# Patient Record
Sex: Male | Born: 1950 | Race: White | Hispanic: No | Marital: Married | Smoking: Former smoker
Health system: Southern US, Community
[De-identification: ages and names within clinical notes are randomized; demographics above are authoritative.]

## PROBLEM LIST (undated history)

## (undated) DIAGNOSIS — D039 Melanoma in situ, unspecified: Secondary | ICD-10-CM

## (undated) DIAGNOSIS — Z720 Tobacco use: Secondary | ICD-10-CM

## (undated) DIAGNOSIS — C439 Malignant melanoma of skin, unspecified: Secondary | ICD-10-CM

## (undated) DIAGNOSIS — R05 Cough: Secondary | ICD-10-CM

## (undated) DIAGNOSIS — E785 Hyperlipidemia, unspecified: Secondary | ICD-10-CM

## (undated) DIAGNOSIS — R058 Other specified cough: Secondary | ICD-10-CM

## (undated) DIAGNOSIS — C4492 Squamous cell carcinoma of skin, unspecified: Secondary | ICD-10-CM

## (undated) DIAGNOSIS — D229 Melanocytic nevi, unspecified: Secondary | ICD-10-CM

## (undated) HISTORY — DX: Cough: R05

## (undated) HISTORY — PX: CARDIAC CATHETERIZATION: SHX172

## (undated) HISTORY — DX: Other specified cough: R05.8

## (undated) HISTORY — DX: Tobacco use: Z72.0

## (undated) HISTORY — DX: Squamous cell carcinoma of skin, unspecified: C44.92

## (undated) HISTORY — DX: Melanocytic nevi, unspecified: D22.9

## (undated) HISTORY — PX: KNEE SURGERY: SHX244

## (undated) HISTORY — DX: Melanoma in situ, unspecified: D03.9

## (undated) HISTORY — DX: Hyperlipidemia, unspecified: E78.5

---
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
## 1998-06-27 ENCOUNTER — Ambulatory Visit (HOSPITAL_COMMUNITY): Admission: RE | Admit: 1998-06-27 | Discharge: 1998-06-27 | Payer: Self-pay | Admitting: Gastroenterology

## 2006-12-08 ENCOUNTER — Encounter: Admission: RE | Admit: 2006-12-08 | Discharge: 2006-12-08 | Payer: Self-pay | Admitting: Gastroenterology

## 2007-01-11 ENCOUNTER — Encounter (INDEPENDENT_AMBULATORY_CARE_PROVIDER_SITE_OTHER): Payer: Self-pay | Admitting: Gastroenterology

## 2007-01-11 ENCOUNTER — Ambulatory Visit (HOSPITAL_COMMUNITY): Admission: RE | Admit: 2007-01-11 | Discharge: 2007-01-11 | Payer: Self-pay | Admitting: Gastroenterology

## 2007-08-10 ENCOUNTER — Inpatient Hospital Stay (HOSPITAL_BASED_OUTPATIENT_CLINIC_OR_DEPARTMENT_OTHER): Admission: RE | Admit: 2007-08-10 | Discharge: 2007-08-10 | Payer: Self-pay | Admitting: Cardiology

## 2008-08-10 IMAGING — RF DG ESOPHAGUS
8 series · 20 of 24 positions shown · non-contrast
Comparison: none

CLINICAL DATA: Dysphagia.
 BARIUM ESOPHAGRAM:

[Series 1: run · 4 of 6 slices shown (1 of 8)]
[im 1/6]
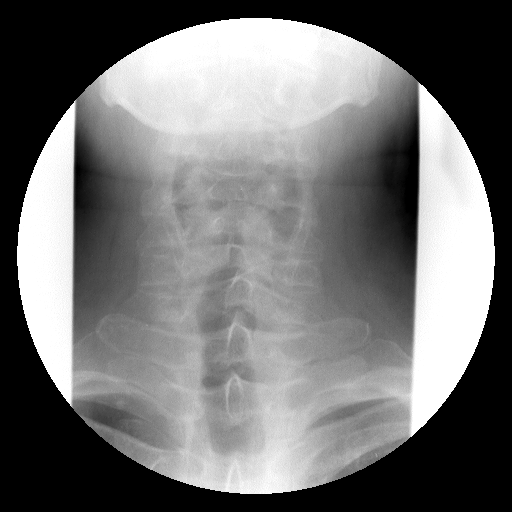
[im 2/6]
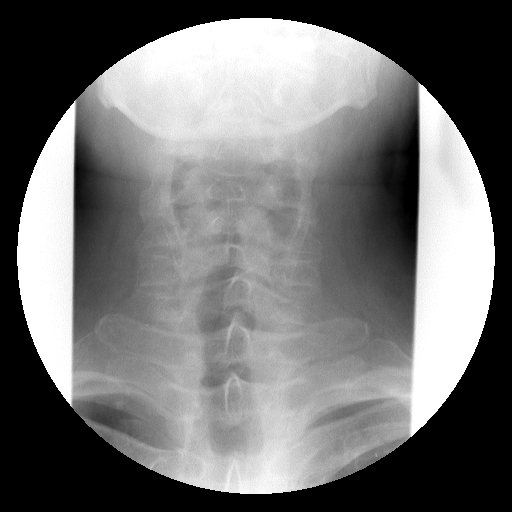
[im 4/6]
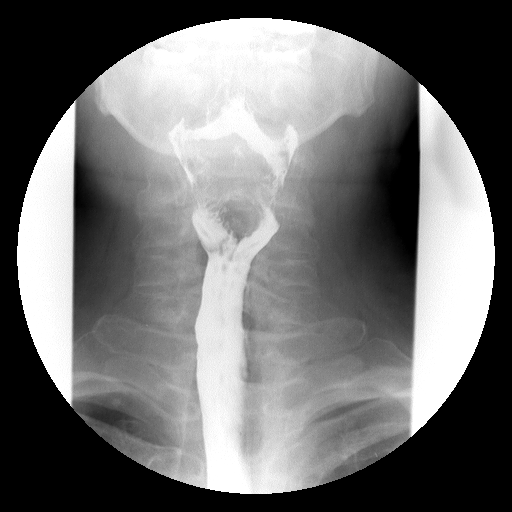
[im 6/6]
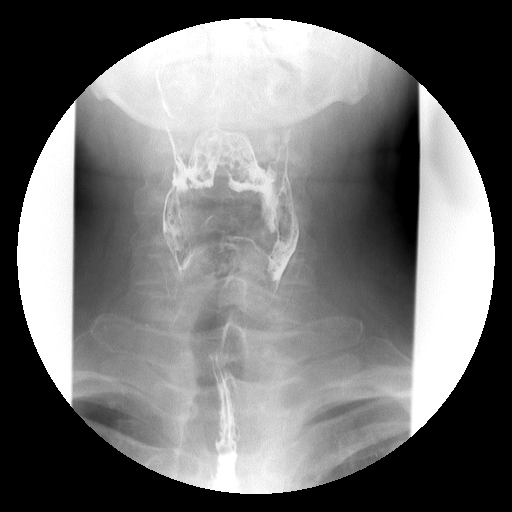

[Series 2: run · 2 of 2 slices shown (2 of 8)]
[im 1/2]
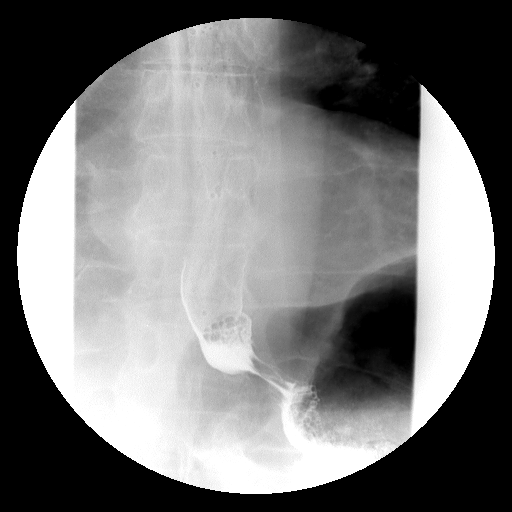
[im 2/2]
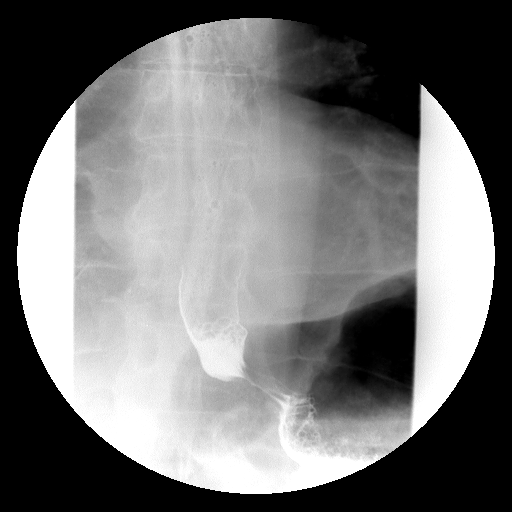

[Series 3: run · 3 of 5 slices shown (3 of 8)]
[im 1/5]
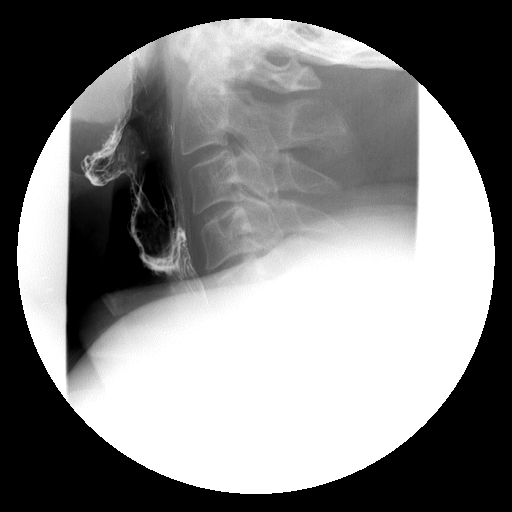
[im 3/5]
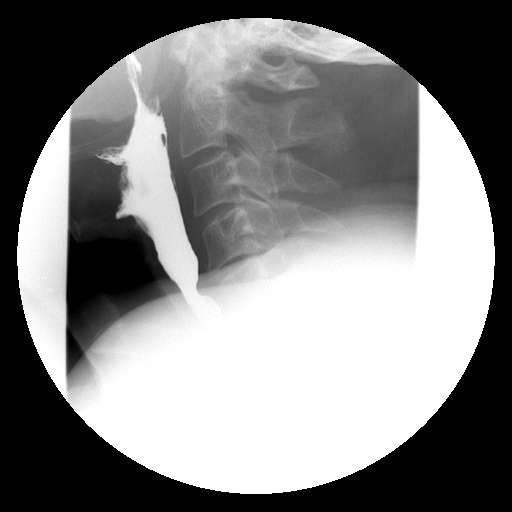
[im 5/5]
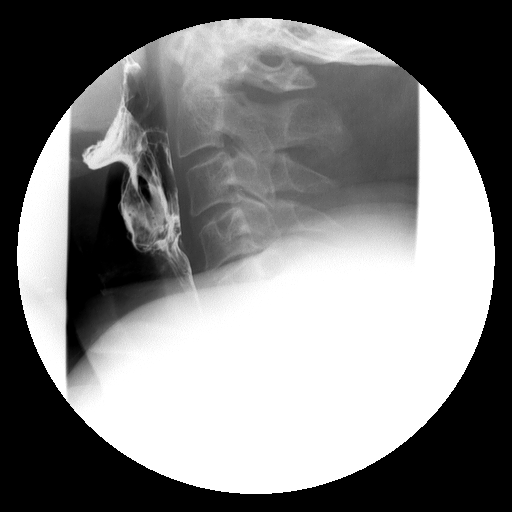

[Series 4: run · 3 of 4 slices shown (4 of 8)]
[im 1/4]
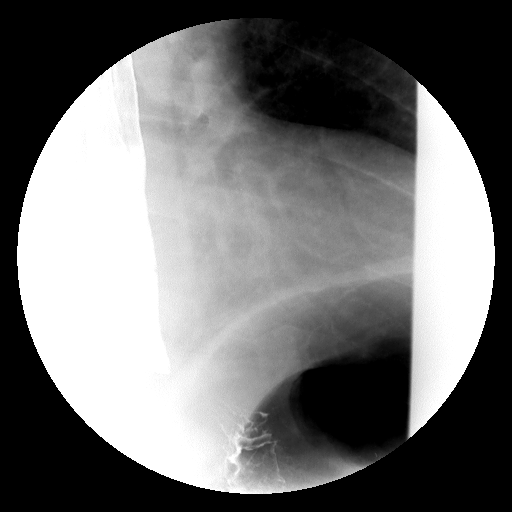
[im 2/4]
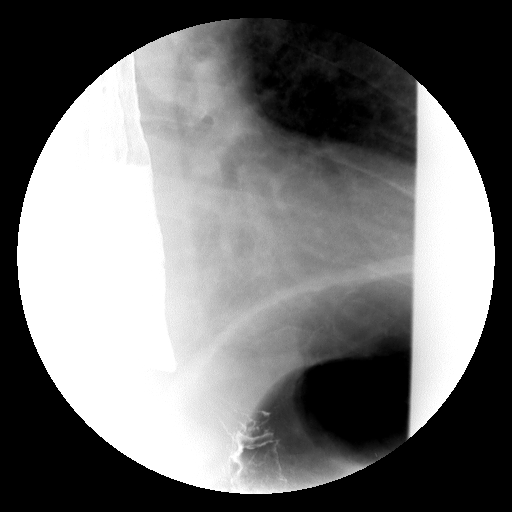
[im 4/4]
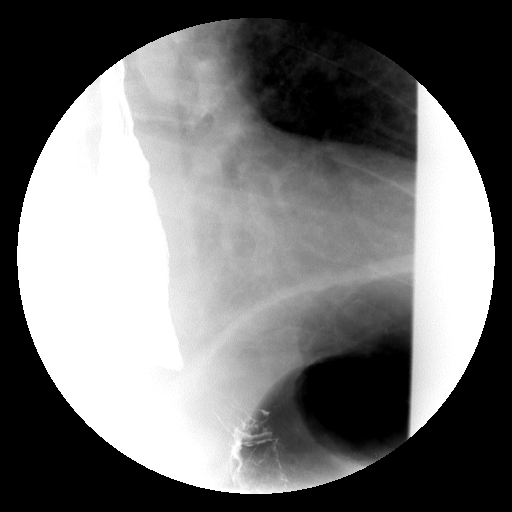

[Series 5: run · 1 of 2 slices shown (5 of 8)]
[im 2/2]
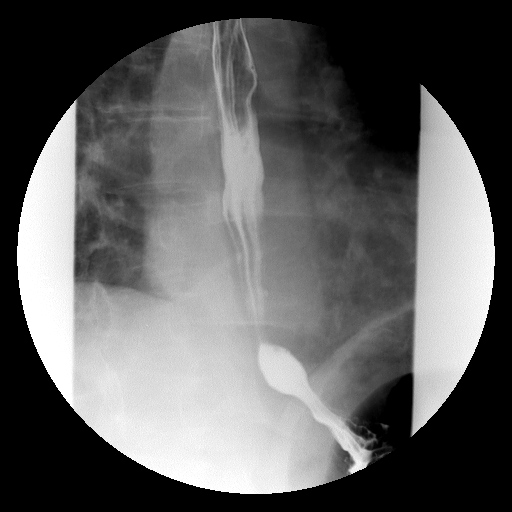

[Series 6: run · 3 of 3 slices shown (6 of 8)]
[im 1/3]
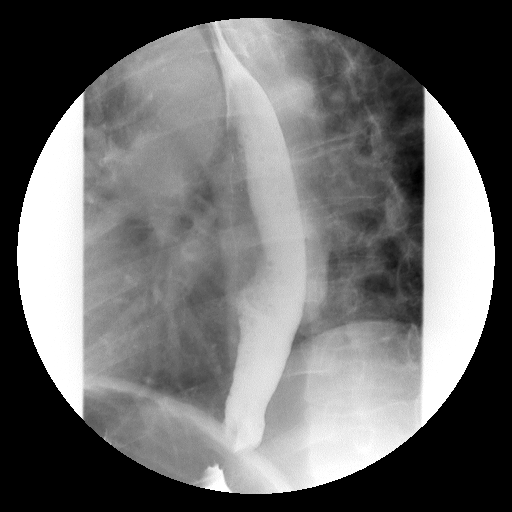
[im 2/3]
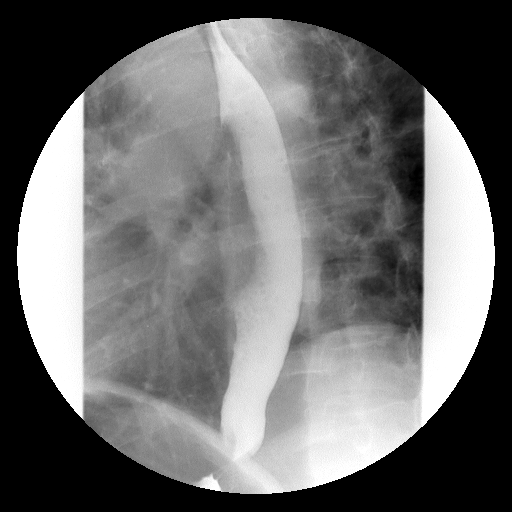
[im 3/3]
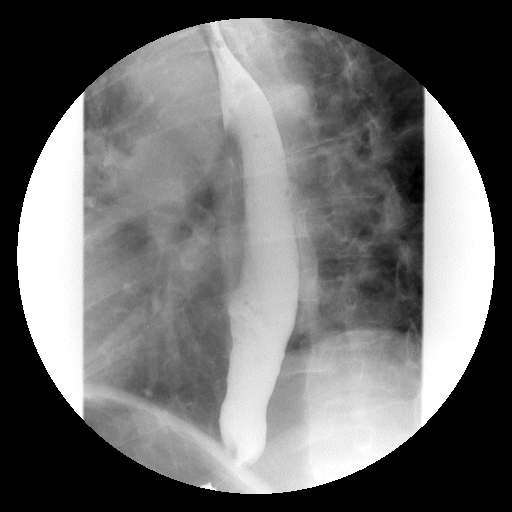

[Series 7: run · 1 of 2 slices shown (7 of 8)]
[im 1/2]
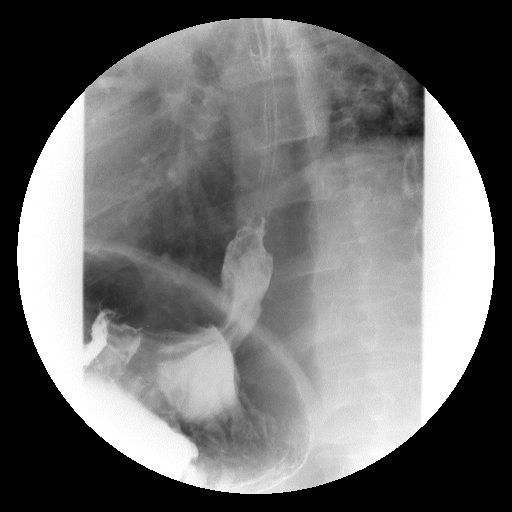

[Series 8: run · 3 of 4 slices shown (8 of 8)]
[im 1/4]
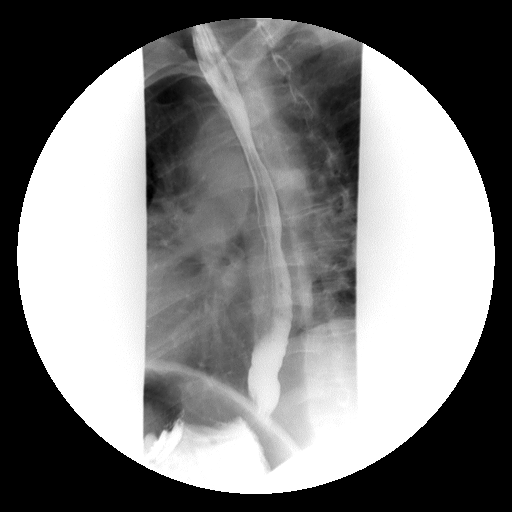
[im 2/4]
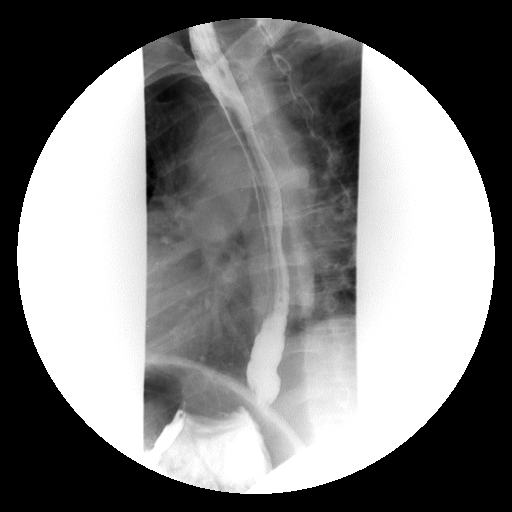
[im 4/4]
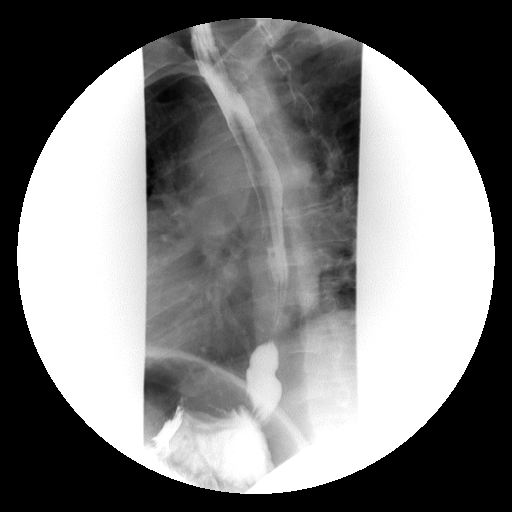

[20 of 24 positions shown; findings below may reference images not displayed]

FINDINGS: The primary peristaltic wave within the esophagus was normal.  There was no hiatal hernia.  There were no episodes of spontaneous gastroesophageal reflux.  A 13 mm in diameter barium tablet was administered and passed normally through the esophagus into the stomach.
IMPRESSION: Normal barium esophagram.

## 2010-11-12 ENCOUNTER — Encounter: Payer: Self-pay | Admitting: *Deleted

## 2010-11-12 DIAGNOSIS — Z720 Tobacco use: Secondary | ICD-10-CM | POA: Insufficient documentation

## 2010-11-12 DIAGNOSIS — R05 Cough: Secondary | ICD-10-CM

## 2010-11-12 DIAGNOSIS — R058 Other specified cough: Secondary | ICD-10-CM

## 2010-11-12 DIAGNOSIS — E785 Hyperlipidemia, unspecified: Secondary | ICD-10-CM | POA: Insufficient documentation

## 2010-11-26 NOTE — Cardiovascular Report (Signed)
NAMECOLMAN, Ryan Guzman NO.:  [id number]   MEDICAL RECORD NO.:  [id number]          PATIENT TYPE:  OIB   LOCATION:  NA                           FACILITY:  [facility]   PHYSICIAN:  Peter M. Swaziland, M.D.  DATE OF BIRTH:  [birth year]   DATE OF PROCEDURE:  08/10/2007  DATE OF DISCHARGE:                            CARDIAC CATHETERIZATION   INDICATION FOR PROCEDURE:  The patient is 60 year old white male who  presented with atypical chest pain.  He has a history of  hypercholesterolemia.  A stress Cardiolite study was abnormal at a high  workload, indicating some apical ischemia   PROCEDURES:  1. Left heart catheterization.  2. Coronary and left ventricular angiography.   ACCESS:  Via the right femoral artery using standard Seldinger  technique.   There were no complications.   MEDICATIONS:  Local anesthesia 1% Xylocaine, Versed 2 mg IV.   CONTRAST:  90 mL of Omnipaque.   EQUIPMENT:  4-French 5-cm left Judkins catheter, 4-French 3-D RC  catheter, 4-French pigtail catheter and 4-French arterial sheath.   HEMODYNAMIC DATA:  Aortic pressure:  Central aortic pressure was 109/67  with a mean of 86 mmHg.  Left ventricular pressure was 104 with an EDP  of 10 mmHg.  There was no significant aortic valve gradient.   ANGIOGRAPHIC DATA:  The left coronary arises and distributes normally.  The left main coronary artery is normal.   The left anterior descending artery has a 20-30% stenosis proximally  after the takeoff of the first septal perforator.  The LAD then extends  out to the apex without significant disease.  There is a modest diagonal  branch, which appears normal.  There is a small to moderate-sized  intermediate branch, which appears normal.   The left circumflex coronary gives rise to a large single obtuse  marginal vessel.  It is normal throughout its course.   The right coronary artery is a dominant vessel.  It has mild  irregularities in the proximal vessel  less than or equal to 20%.   Left ventricular angiography was performed in the RAO view.  This  demonstrates normal left ventricular size and contractility with normal  systolic function.  Ejection fraction is estimated at 60%.  There is no  mitral regurgitation or prolapse.   FINAL INTERPRETATION:  1. Nonobstructive atherosclerotic coronary artery disease.  2. Normal left ventricular function.   PLAN:  Would recommend continued risk factor modification.           ______________________________  Peter M. Swaziland, M.D.     PMJ/MEDQ  D:  08/10/2007  T:  08/10/2007  Job:  [id number]   cc:   Cassell Clement, M.D.  Gloriajean Dell. Andrey Campanile, M.D.

## 2010-11-26 NOTE — H&P (Signed)
NAMEJAYSTIN, MCGARVEY NO.:  [id number]   MEDICAL RECORD NO.:  [id number]           PATIENT TYPE:   LOCATION:                                 FACILITY:   PHYSICIAN:  Peter M. Swaziland, M.D.  DATE OF BIRTH:  1951-04-19   DATE OF ADMISSION:  DATE OF DISCHARGE:                              HISTORY & PHYSICAL   HISTORY OF PRESENT ILLNESS:  Mr. Ryan Guzman is a 60 year old white male who  is seen for evaluation of atypical chest pain.  The patient gives a  history of recent chest pain since January 11.  He describes it as a  sharp pain in the left pectoral region.  It does not radiate.  Pain has  been off and on but fairly constant.  He has tried antacids without  benefit.  He does have a chronic history of dyspepsia and increased  phlegm.  He has had a endoscopy done by Dr. Loreta Ave, who placed him on  Protonix and Zegerid, but apparently did not see significant acid  reflux.  The patient has generally been active, previously exercised and  ran in triathlons but several years ago due to a job change his exercise  activity has  been curtailed.  He has gained a significant amount of  weight.  He complains of a lack of energy, staying tired all the time,  and he does have elevated cholesterol level.  Because of these atypical  chest pains, he did undergo a stress Cardiolite study on August 03, 2007.  The patient exercised very well with standard Bruce protocol,  able to exercise for 7 minutes and 30 seconds without chest pain or ECG  changes; however, his Cardiolite study demonstrated a reversible apical  defect consistent with ischemia.  His ejection fraction was 53%.  The  patient also had an echocardiogram performed, which showed mild aortic  valve sclerosis but otherwise a normal study.  Based on his abnormal  Cardiolite study, it was recommended he undergo cardiac catheterization.   PAST MEDICAL HISTORY:  Significant for previous arthroscopic surgery on  his left knee.  He  has a history of hypercholesterolemia with recent  lipid panel showed total cholesterol of 221, LDL of 144, HDL 36.   ALLERGIES:  SULFA AND AUGMENTIN.   CURRENT MEDICATIONS:  Protonix 40 mg b.i.d., aspirin 81 mg per day,  Zegerid 40 mg per day.   SOCIAL HISTORY:  He works for [employer] as a Merchandiser, retail.  He  smokes 2 to 3 cigarettes per day.  Denies alcohol use.  He is married.  His wife is a retired Surveyor, mining.  They have 2 children in  good health.   FAMILY HISTORY:  Father had coronary disease and aortic stenosis and  died after undergoing aortic valve replacement and bypass surgery.  Mother died of a myocardial infarction at age 60 but also had cancer.   REVIEW OF SYSTEMS:  Negative in detail.   PHYSICAL EXAMINATION:  GENERAL:  The patient is a pleasant white male in  no apparent distress.  VITAL SIGNS:  Weight is  252, blood pressure 118/82, pulse is 88 and  regular, respirations were normal.  HEENT:  He is normocephalic, atraumatic with pupils equal, round,  reactive to light and accommodation.  Extraocular movements are full.  Oropharynx is clear.  NECK:  Supple without JVD, adenopathy, thyromegaly or bruits.  LUNGS:  Clear to auscultation and percussion.  CARDIAC:  Exam reveals a regular rate and rhythm without gallop, murmur,  rub or click.  ABDOMEN:  Obese, soft, and nontender.  There is no hepatosplenomegaly,  masses or bruits.  EXTREMITIES:  Femoral and pedal pulses are 2+ and symmetric.  He has no  edema.  NEUROLOGIC:  Exam is nonfocal.   LABORATORY DATA:  Chest x-ray showed borderline cardiomegaly with  otherwise normal exam.  ECG showed normal sinus rhythm with left axis  deviation, otherwise normal.   IMPRESSION:  1. Abnormal stress Cardiolite study showing apical ischemia.  2. Hypercholesterolemia.  3. Tobacco use.   PLAN:  We will proceed with diagnostic cardiac catheterization with  further therapy pending these results.            ______________________________  Peter M. Swaziland, M.D.     PMJ/MEDQ  D:  08/06/2007  T:  08/06/2007  Job:  [id number]   cc:   Cassell Clement, M.D.  Gloriajean Dell. Andrey Campanile, M.D.

## 2010-11-26 NOTE — Op Note (Signed)
Ryan Guzman, COBARRUBIAS NO.:  [id number]   MEDICAL RECORD NO.:  [id number]          PATIENT TYPE:  AMB   LOCATION:  ENDO                         FACILITY:  [facility]   PHYSICIAN:  Anselmo Rod, M.D.  DATE OF BIRTH:  Mar 07, 1951   DATE OF PROCEDURE:  01/11/2007  DATE OF DISCHARGE:                               OPERATIVE REPORT   PROCEDURE PERFORMED:  Esophagogastroduodenoscopy with multiple  esophageal biopsies.   ENDOSCOPIST:  Anselmo Rod, MD   INSTRUMENT USED:  Pentax video panendoscope.   INDICATIONS FOR PROCEDURE:  [age]-year-old white male with a history  of dysphagia, undergoing an EGD.  The patient had a history of allergies  and has had problems swallowing; therefore esophageal biopsies are  planned.   PREPROCEDURE PREPARATION:  Informed consent was procured from the  patient.  The risks and benefits of the procedure including  complications related with dilatation were discussed with the patient as  well.   PREPROCEDURE PHYSICAL:  VITAL SIGNS:  The patient had stable vital  signs.  NECK:  Supple.  CHEST:  Clear to auscultation.  S1 and S2 regular.  ABDOMEN:  Soft with normal bowel sounds.   DESCRIPTION OF PROCEDURE:  The patient was placed in left lateral  decubitus position and sedated with 100 mcg of Fentanyl and 10 mg of  Versed given intravenously in slow incremental doses.  Once the patient  was adequately sedate and maintained on low-flow oxygen and continuous  cardiac monitoring, the Pentax video panendoscope was advanced through  the mouthpiece, over the tongue and into the esophagus under direct  vision.  The entire esophagus was widely patent with no evidence of  ring, stricture, mass, esophagitis or Barrett's mucosa.  The scope was  then advanced into the stomach.  There was mild antral gastritis noted.  Retroflexion in the high cardia revealed no abnormalities.  The proximal  small bowel appeared normal.  There was no outlet  obstruction.  Esophageal biopsies were done to rule out eosinophilic esophagitis.  The  patient tolerated the procedure well without complications.   IMPRESSION:  1. Widely patent esophagus with no evidence of ring, stricture, masses      or esophagitis, biopsies done to rule out eosinophilic esophagitis.  2. Mild antral gastritis.  3. No evidence of a hiatal hernia.  4. Normal proximal small bowel.   RECOMMENDATIONS:  1. Await pathology results.  2. ENT evaluation if symptoms persist.  3. Avoid all nonsteroidals including aspirin for the next 2 weeks.  4. Continue PPIs.  5. Outpatient followup as need arises in the future.      Anselmo Rod, M.D.  Electronically Signed     JNM/MEDQ  D:  01/11/2007  T:  01/12/2007  Job:  [id number]   cc:   Gloriajean Dell. Andrey Campanile, M.D.  Fax: [phone]

## 2011-09-24 DIAGNOSIS — D039 Melanoma in situ, unspecified: Secondary | ICD-10-CM

## 2011-09-24 HISTORY — DX: Melanoma in situ, unspecified: D03.9

## 2011-11-24 DIAGNOSIS — C439 Malignant melanoma of skin, unspecified: Secondary | ICD-10-CM

## 2011-11-24 HISTORY — DX: Malignant melanoma of skin, unspecified: C43.9

## 2013-05-11 DIAGNOSIS — D229 Melanocytic nevi, unspecified: Secondary | ICD-10-CM

## 2013-05-11 HISTORY — DX: Melanocytic nevi, unspecified: D22.9

## 2013-06-30 ENCOUNTER — Other Ambulatory Visit: Payer: Self-pay | Admitting: Dermatology

## 2015-02-13 DIAGNOSIS — C4492 Squamous cell carcinoma of skin, unspecified: Secondary | ICD-10-CM

## 2015-02-13 HISTORY — DX: Squamous cell carcinoma of skin, unspecified: C44.92

## 2015-03-22 ENCOUNTER — Other Ambulatory Visit: Payer: Self-pay | Admitting: Dermatology

## 2018-10-07 ENCOUNTER — Encounter: Payer: Self-pay | Admitting: *Deleted

## 2019-01-26 ENCOUNTER — Encounter: Payer: Self-pay | Admitting: *Deleted

## 2019-08-15 ENCOUNTER — Other Ambulatory Visit: Payer: Self-pay | Admitting: Dermatology

## 2019-09-08 ENCOUNTER — Other Ambulatory Visit: Payer: Self-pay | Admitting: Dermatology

## 2020-10-25 ENCOUNTER — Encounter: Payer: Self-pay | Admitting: Dermatology

## 2020-10-25 ENCOUNTER — Ambulatory Visit (INDEPENDENT_AMBULATORY_CARE_PROVIDER_SITE_OTHER): Payer: Medicare Other | Admitting: Dermatology

## 2020-10-25 ENCOUNTER — Other Ambulatory Visit: Payer: Self-pay

## 2020-10-25 DIAGNOSIS — Z85828 Personal history of other malignant neoplasm of skin: Secondary | ICD-10-CM

## 2020-10-25 DIAGNOSIS — Z86018 Personal history of other benign neoplasm: Secondary | ICD-10-CM | POA: Diagnosis not present

## 2020-10-25 DIAGNOSIS — L729 Follicular cyst of the skin and subcutaneous tissue, unspecified: Secondary | ICD-10-CM

## 2020-10-25 DIAGNOSIS — Z85820 Personal history of malignant melanoma of skin: Secondary | ICD-10-CM

## 2020-10-25 DIAGNOSIS — Z1283 Encounter for screening for malignant neoplasm of skin: Secondary | ICD-10-CM | POA: Diagnosis not present

## 2020-11-04 ENCOUNTER — Encounter: Payer: Self-pay | Admitting: Dermatology

## 2020-11-04 NOTE — Progress Notes (Signed)
   Follow-Up Visit   Subjective  Ryan Guzman. is a 70 y.o. male who presents for the following: Annual Exam (Yearly skin check per chart review history of MM in 2013, scc and atypical moles.  Per patient inflamed cyst on back currently active drained a lot last night on shirt.).  General skin check plus cyst on lower back inflamed for past day. Location:  Duration:  Quality:  Associated Signs/Symptoms: Modifying Factors:  Severity:  Timing: Context:   Objective  Well appearing patient in no apparent distress; mood and affect are within normal limits. Objective  Right Lower Back: Fluctuant to centimeter soft nodule with minimal drainage on compression.  Objective  Mid Back: General skin examination, no sign new or recurrent melanoma, no regional adenopathy, no new or recurrent nonmelanoma skin cancer.    A full examination was performed including scalp, head, eyes, ears, nose, lips, neck, chest, axillae, abdomen, back, buttocks, bilateral upper extremities, bilateral lower extremities, hands, feet, fingers, toes, fingernails, and toenails. All findings within normal limits unless otherwise noted below.   Assessment & Plan    Cyst of skin Right Lower Back  After Hibiclens prep the nodule was incised with a #11 scalpel blade, roughly 2 cc pus plus blood drained.  Will use moist heat to help continue drainage of lesion.  Incision and Drainage - Right Lower Back  Encounter for screening for malignant neoplasm of skin Mid Back  Annual skin examination, self examine with spouse twice annually, continued ultraviolet protection.      I, Lavonna Monarch, MD, have reviewed all documentation for this visit.  The documentation on 11/04/20 for the exam, diagnosis, procedures, and orders are all accurate and complete.

## 2020-12-03 ENCOUNTER — Ambulatory Visit: Payer: Self-pay | Admitting: Dermatology
